# Patient Record
Sex: Male | Born: 1985 | Race: Black or African American | Hispanic: No | Marital: Single | Smoking: Current every day smoker
Health system: Southern US, Community
[De-identification: ages and names within clinical notes are randomized; demographics above are authoritative.]

---
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
## 2009-05-11 ENCOUNTER — Emergency Department (HOSPITAL_COMMUNITY): Admission: EM | Admit: 2009-05-11 | Discharge: 2009-05-11 | Payer: Self-pay | Admitting: Emergency Medicine

## 2010-12-24 ENCOUNTER — Emergency Department (HOSPITAL_COMMUNITY)
Admission: EM | Admit: 2010-12-24 | Discharge: 2010-12-24 | Disposition: A | Discharge status: Home / Self Care | Payer: No Typology Code available for payment source | Attending: Emergency Medicine | Admitting: Emergency Medicine

## 2010-12-24 ENCOUNTER — Emergency Department (HOSPITAL_COMMUNITY): Payer: Self-pay

## 2010-12-24 DIAGNOSIS — W540XXA Bitten by dog, initial encounter: Secondary | ICD-10-CM | POA: Insufficient documentation

## 2010-12-24 DIAGNOSIS — S61409A Unspecified open wound of unspecified hand, initial encounter: Secondary | ICD-10-CM | POA: Insufficient documentation

## 2010-12-24 DIAGNOSIS — S71009A Unspecified open wound, unspecified hip, initial encounter: Secondary | ICD-10-CM | POA: Insufficient documentation

## 2010-12-24 DIAGNOSIS — S71109A Unspecified open wound, unspecified thigh, initial encounter: Secondary | ICD-10-CM | POA: Insufficient documentation

## 2011-01-04 ENCOUNTER — Emergency Department (HOSPITAL_COMMUNITY)
Admission: EM | Admit: 2011-01-04 | Discharge: 2011-01-04 | Disposition: A | Discharge status: Home / Self Care | Payer: No Typology Code available for payment source | Attending: Emergency Medicine | Admitting: Emergency Medicine

## 2011-01-04 DIAGNOSIS — Z4802 Encounter for removal of sutures: Secondary | ICD-10-CM | POA: Insufficient documentation
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
## 2012-04-19 IMAGING — CR DG HAND COMPLETE 3+V*R*
4 series · 4 of 4 positions shown · non-contrast
Comparison: None.

CLINICAL DATA: Laceration, dog bite

RIGHT HAND - COMPLETE 3+ VIEW

[x hand ap right]
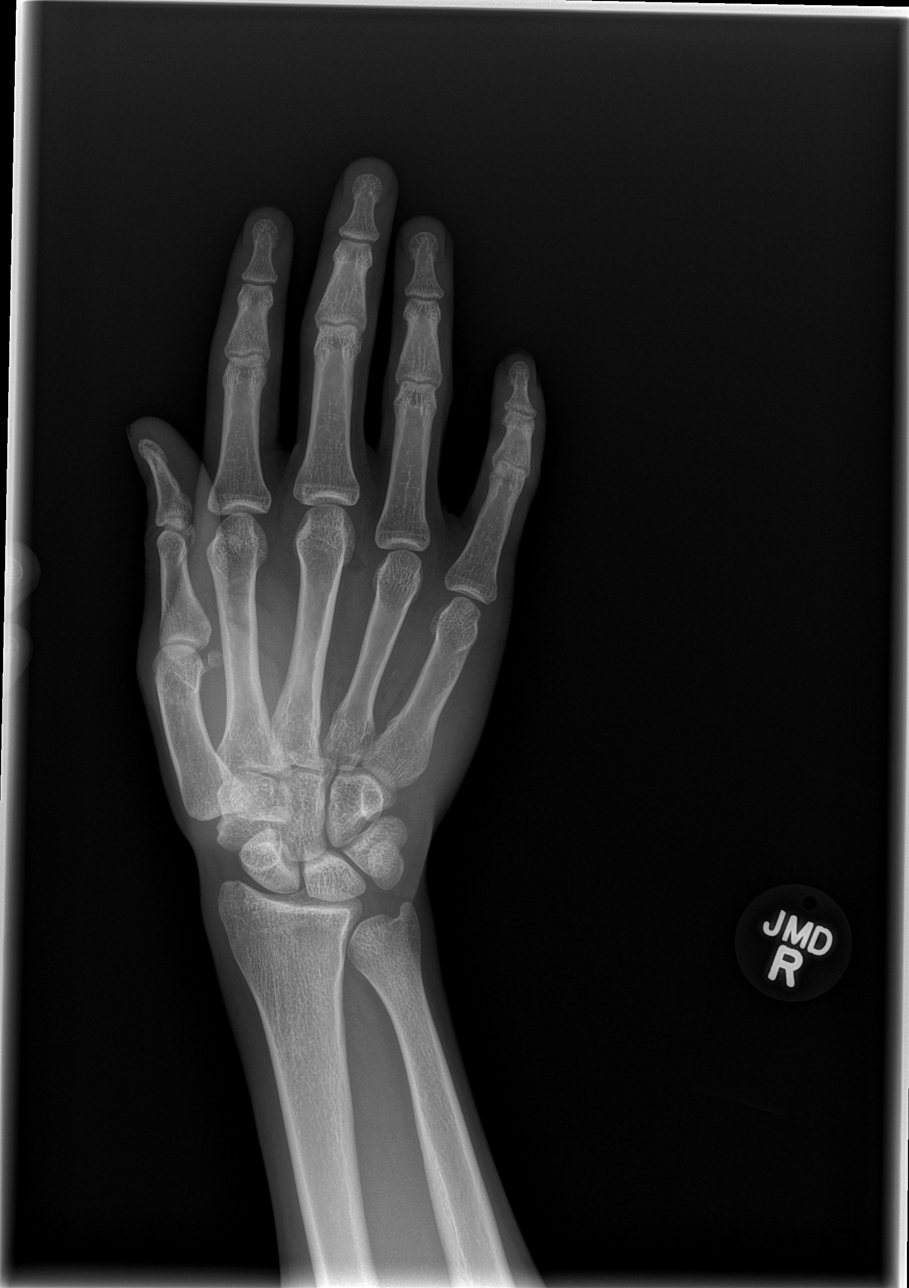

[x hand oblique right]
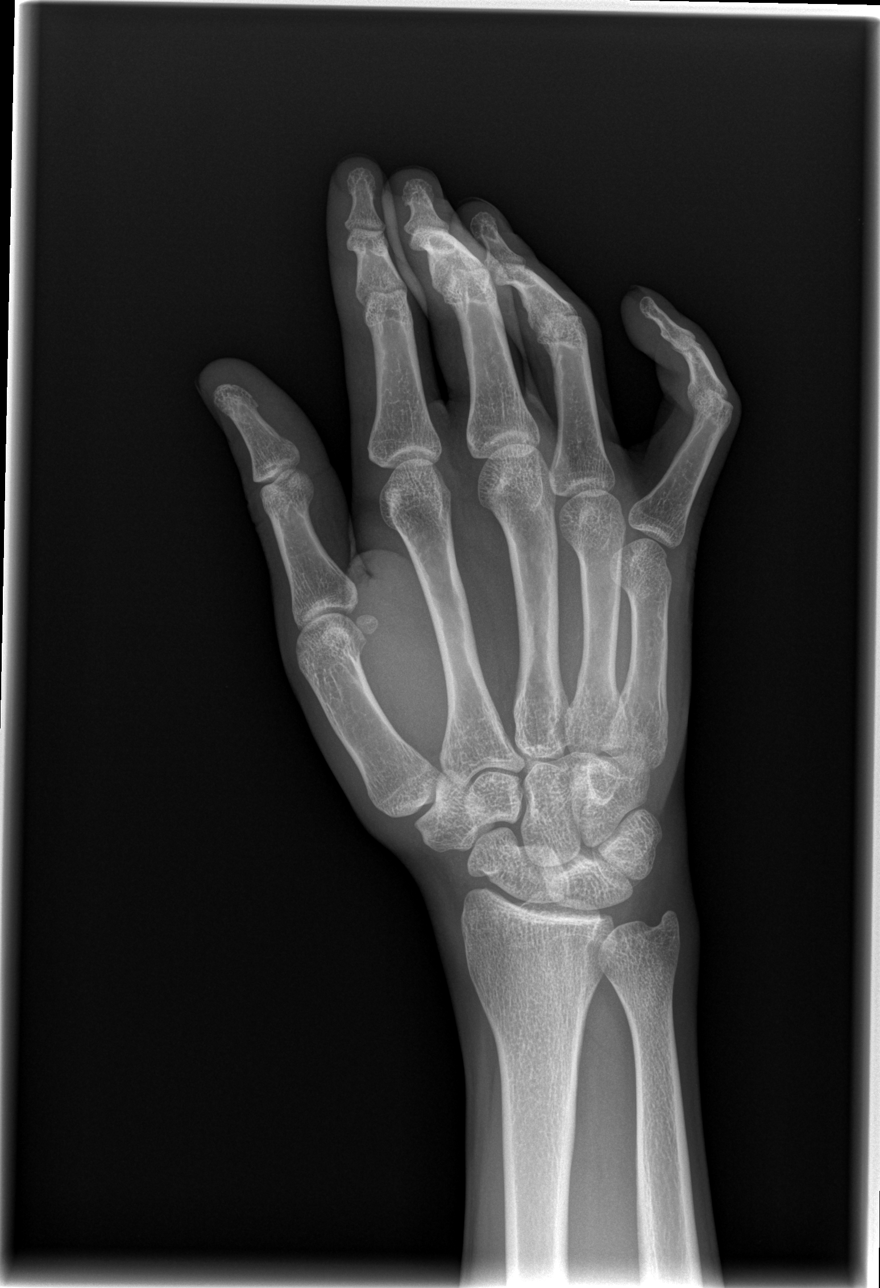

[x hand lat right (1 of 2)]
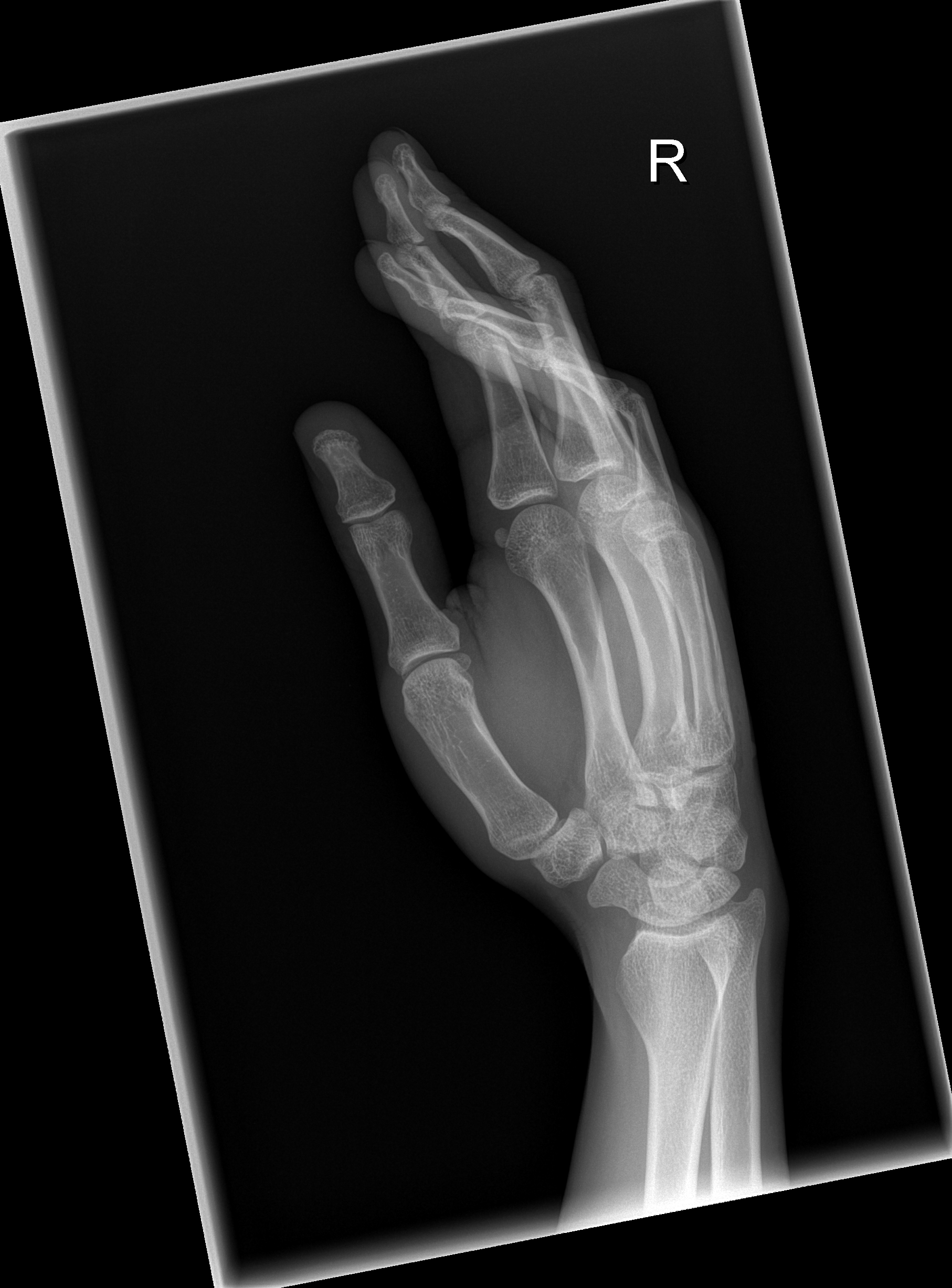

[x hand lat right (2 of 2)]
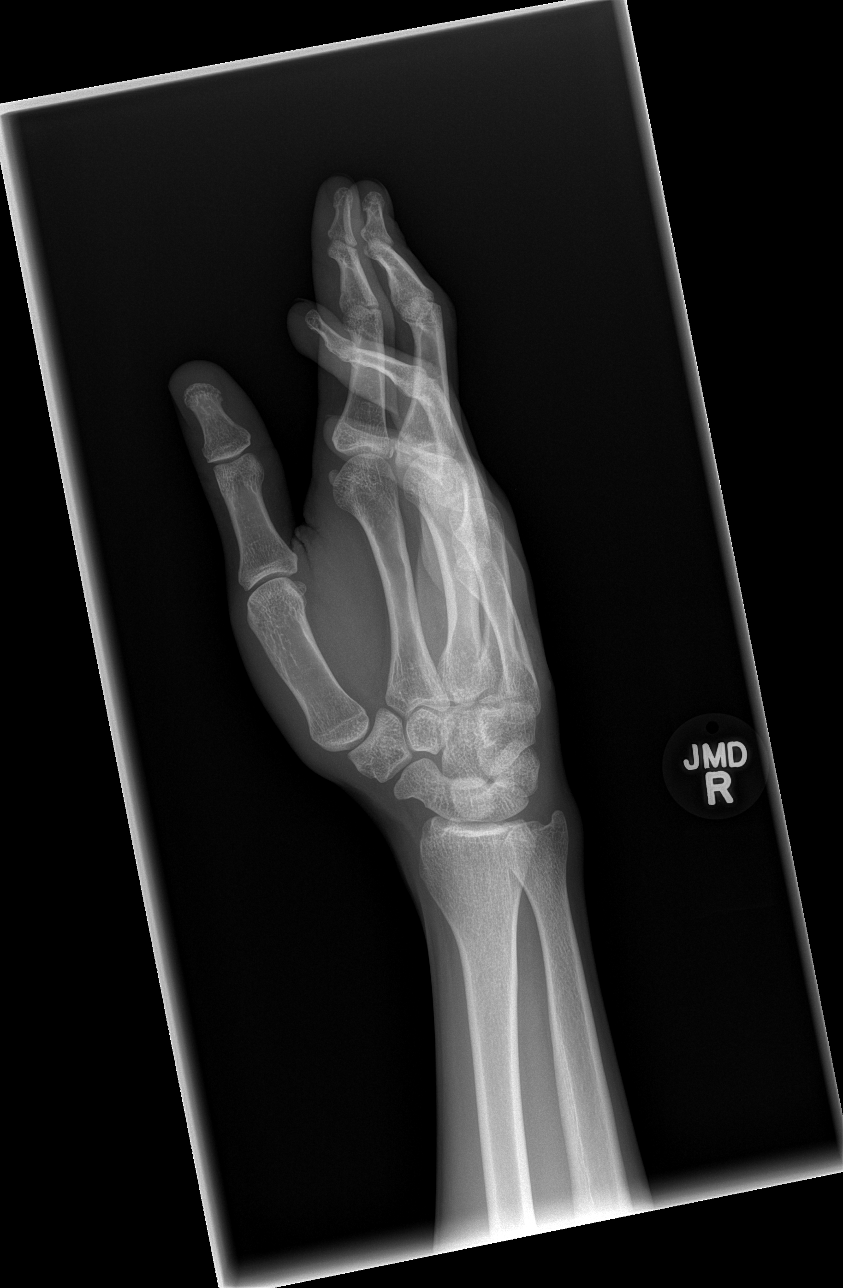

[4 of 4 positions shown; findings below may reference images not displayed]

FINDINGS: No evidence of fracture in the right hand.  Joint spaces
are normal.  No soft tissue abnormality.  No radiodense foreign
body.  There are nonstandard views due to the patient's inability
to cooperate.
IMPRESSION: No evidence of fracture or radiodense foreign body.

## 2015-06-27 ENCOUNTER — Ambulatory Visit (INDEPENDENT_AMBULATORY_CARE_PROVIDER_SITE_OTHER): Payer: Self-pay | Admitting: Physician Assistant

## 2015-06-27 VITALS — BP 118/60 | HR 75 | Temp 98.6°F | Resp 16 | Ht 69.0 in | Wt 137.0 lb

## 2015-06-27 DIAGNOSIS — A510 Primary genital syphilis: Secondary | ICD-10-CM

## 2015-06-27 DIAGNOSIS — Z113 Encounter for screening for infections with a predominantly sexual mode of transmission: Secondary | ICD-10-CM

## 2015-06-27 MED ORDER — PENICILLIN G BENZATHINE 1200000 UNIT/2ML IM SUSP: 1.2000 10*6.[IU] | Freq: Once | INTRAMUSCULAR | Status: DC

## 2015-06-27 MED ORDER — PENICILLIN G BENZATHINE 1200000 UNIT/2ML IM SUSP
2.4000 10*6.[IU] | Freq: Once | INTRAMUSCULAR | Status: AC
  Administered 2015-06-27: 2.4 10*6.[IU] via INTRAMUSCULAR
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
## 2015-06-28 LAB — RPR

## 2015-06-28 LAB — HIV ANTIBODY (ROUTINE TESTING W REFLEX): HIV 1&2 Ab, 4th Generation: NONREACTIVE

## 2015-06-29 ENCOUNTER — Other Ambulatory Visit: Payer: Self-pay | Admitting: Physician Assistant

## 2015-06-29 DIAGNOSIS — R300 Dysuria: Secondary | ICD-10-CM

## 2015-06-29 LAB — GC/CHLAMYDIA PROBE AMP
CT Probe RNA: NEGATIVE
GC Probe RNA: NEGATIVE

## 2017-07-24 ENCOUNTER — Emergency Department (HOSPITAL_COMMUNITY)
Admission: EM | Admit: 2017-07-24 | Discharge: 2017-07-25 | Disposition: A | Discharge status: Home / Self Care | Payer: Self-pay | Attending: Emergency Medicine | Admitting: Emergency Medicine

## 2017-07-24 ENCOUNTER — Encounter (HOSPITAL_COMMUNITY): Payer: Self-pay | Admitting: Emergency Medicine

## 2017-07-24 DIAGNOSIS — Z23 Encounter for immunization: Secondary | ICD-10-CM | POA: Insufficient documentation

## 2017-07-24 DIAGNOSIS — W260XXA Contact with knife, initial encounter: Secondary | ICD-10-CM | POA: Insufficient documentation

## 2017-07-24 DIAGNOSIS — Y93G1 Activity, food preparation and clean up: Secondary | ICD-10-CM | POA: Insufficient documentation

## 2017-07-24 DIAGNOSIS — S61211A Laceration without foreign body of left index finger without damage to nail, initial encounter: Secondary | ICD-10-CM | POA: Insufficient documentation

## 2017-07-24 DIAGNOSIS — F17210 Nicotine dependence, cigarettes, uncomplicated: Secondary | ICD-10-CM | POA: Insufficient documentation

## 2017-07-24 DIAGNOSIS — Y92000 Kitchen of unspecified non-institutional (private) residence as  the place of occurrence of the external cause: Secondary | ICD-10-CM | POA: Insufficient documentation

## 2017-07-24 DIAGNOSIS — Y998 Other external cause status: Secondary | ICD-10-CM | POA: Insufficient documentation

## 2017-07-24 MED ORDER — LIDOCAINE HCL (PF) 1 % IJ SOLN
5.0000 mL | Freq: Once | INTRAMUSCULAR | Status: AC
  Administered 2017-07-25: 5 mL
  Filled 2017-07-24: qty 5

## 2017-07-24 NOTE — ED Notes (Signed)
Pt in room with pressure dressing applied to finger. Pt appears anxious- family at bedside
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
## 2017-07-25 MED ORDER — BACITRACIN ZINC 500 UNIT/GM EX OINT: TOPICAL_OINTMENT | Freq: Two times a day (BID) | CUTANEOUS | Status: DC

## 2017-07-25 MED ORDER — TETANUS-DIPHTH-ACELL PERTUSSIS 5-2.5-18.5 LF-MCG/0.5 IM SUSP
0.5000 mL | Freq: Once | INTRAMUSCULAR | Status: AC
  Administered 2017-07-25: 0.5 mL via INTRAMUSCULAR
  Filled 2017-07-25: qty 0.5

## 2017-07-25 NOTE — ED Notes (Signed)
Bacitracin applied to L index finger with bandage in place
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
## 2018-12-01 ENCOUNTER — Encounter (HOSPITAL_COMMUNITY): Payer: Self-pay | Admitting: Emergency Medicine

## 2018-12-01 ENCOUNTER — Emergency Department (HOSPITAL_COMMUNITY)
Admission: EM | Admit: 2018-12-01 | Discharge: 2018-12-01 | Disposition: A | Discharge status: Home / Self Care | Payer: Self-pay | Attending: Emergency Medicine | Admitting: Emergency Medicine

## 2018-12-01 DIAGNOSIS — Z5321 Procedure and treatment not carried out due to patient leaving prior to being seen by health care provider: Secondary | ICD-10-CM | POA: Insufficient documentation

## 2018-12-01 DIAGNOSIS — F10920 Alcohol use, unspecified with intoxication, uncomplicated: Secondary | ICD-10-CM | POA: Insufficient documentation

## 2018-12-01 NOTE — ED Notes (Signed)
Patient seen exiting ED lobby, ambulatory with steady gait and nad
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
